# Patient Record
Sex: Male | Born: 1985 | Race: Black or African American | Hispanic: No | Marital: Single | Smoking: Current some day smoker
Health system: Southern US, Community
[De-identification: ages and names within clinical notes are randomized; demographics above are authoritative.]

## PROBLEM LIST (undated history)

## (undated) DIAGNOSIS — J302 Other seasonal allergic rhinitis: Secondary | ICD-10-CM

---
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
## 2009-01-16 ENCOUNTER — Emergency Department (HOSPITAL_BASED_OUTPATIENT_CLINIC_OR_DEPARTMENT_OTHER): Admission: EM | Admit: 2009-01-16 | Discharge: 2009-01-16 | Payer: Self-pay | Admitting: Emergency Medicine

## 2009-01-22 ENCOUNTER — Ambulatory Visit: Payer: Self-pay | Admitting: Diagnostic Radiology

## 2009-01-22 ENCOUNTER — Emergency Department (HOSPITAL_BASED_OUTPATIENT_CLINIC_OR_DEPARTMENT_OTHER): Admission: EM | Admit: 2009-01-22 | Discharge: 2009-01-22 | Payer: Self-pay | Admitting: Emergency Medicine

## 2011-01-02 ENCOUNTER — Emergency Department (INDEPENDENT_AMBULATORY_CARE_PROVIDER_SITE_OTHER): Payer: Self-pay

## 2011-01-02 ENCOUNTER — Emergency Department (HOSPITAL_BASED_OUTPATIENT_CLINIC_OR_DEPARTMENT_OTHER)
Admission: EM | Admit: 2011-01-02 | Discharge: 2011-01-02 | Disposition: A | Discharge status: Home / Self Care | Payer: Self-pay | Attending: Emergency Medicine | Admitting: Emergency Medicine

## 2011-01-02 DIAGNOSIS — R6883 Chills (without fever): Secondary | ICD-10-CM

## 2011-01-02 DIAGNOSIS — R05 Cough: Secondary | ICD-10-CM

## 2011-01-02 DIAGNOSIS — R059 Cough, unspecified: Secondary | ICD-10-CM | POA: Insufficient documentation

## 2011-01-02 DIAGNOSIS — J111 Influenza due to unidentified influenza virus with other respiratory manifestations: Secondary | ICD-10-CM | POA: Insufficient documentation

## 2011-01-02 DIAGNOSIS — R112 Nausea with vomiting, unspecified: Secondary | ICD-10-CM | POA: Insufficient documentation

## 2011-01-02 LAB — URINE MICROSCOPIC-ADD ON

## 2011-01-02 LAB — URINALYSIS, ROUTINE W REFLEX MICROSCOPIC
Bilirubin Urine: NEGATIVE
Glucose, UA: NEGATIVE mg/dL
Hgb urine dipstick: NEGATIVE
Specific Gravity, Urine: 1.035 — ABNORMAL HIGH (ref 1.005–1.030)
pH: 7 (ref 5.0–8.0)

## 2011-01-02 LAB — BASIC METABOLIC PANEL
CO2: 23 mEq/L (ref 19–32)
Glucose, Bld: 108 mg/dL — ABNORMAL HIGH (ref 70–99)
Potassium: 4 mEq/L (ref 3.5–5.1)
Sodium: 139 mEq/L (ref 135–145)

## 2011-01-06 ENCOUNTER — Emergency Department (HOSPITAL_BASED_OUTPATIENT_CLINIC_OR_DEPARTMENT_OTHER)
Admission: EM | Admit: 2011-01-06 | Discharge: 2011-01-06 | Disposition: A | Discharge status: Home / Self Care | Payer: Self-pay | Attending: Emergency Medicine | Admitting: Emergency Medicine

## 2011-01-06 ENCOUNTER — Emergency Department (INDEPENDENT_AMBULATORY_CARE_PROVIDER_SITE_OTHER): Payer: Self-pay

## 2011-01-06 DIAGNOSIS — R05 Cough: Secondary | ICD-10-CM | POA: Insufficient documentation

## 2011-01-06 DIAGNOSIS — J40 Bronchitis, not specified as acute or chronic: Secondary | ICD-10-CM | POA: Insufficient documentation

## 2011-01-06 DIAGNOSIS — R059 Cough, unspecified: Secondary | ICD-10-CM | POA: Insufficient documentation

## 2011-05-28 ENCOUNTER — Emergency Department (HOSPITAL_BASED_OUTPATIENT_CLINIC_OR_DEPARTMENT_OTHER)
Admission: EM | Admit: 2011-05-28 | Discharge: 2011-05-28 | Disposition: A | Discharge status: Home / Self Care | Payer: Self-pay | Attending: Emergency Medicine | Admitting: Emergency Medicine

## 2011-05-28 ENCOUNTER — Encounter: Payer: Self-pay | Admitting: *Deleted

## 2011-05-28 DIAGNOSIS — K0889 Other specified disorders of teeth and supporting structures: Secondary | ICD-10-CM

## 2011-05-28 DIAGNOSIS — K089 Disorder of teeth and supporting structures, unspecified: Secondary | ICD-10-CM | POA: Insufficient documentation

## 2011-05-28 MED ORDER — OXYCODONE-ACETAMINOPHEN 5-325 MG PO TABS
1.0000 | ORAL_TABLET | Freq: Once | ORAL | Status: AC
  Administered 2011-05-28: 1 via ORAL
  Filled 2011-05-28: qty 1

## 2011-05-28 MED ORDER — OXYCODONE-ACETAMINOPHEN 5-325 MG PO TABS: 2.0000 | ORAL_TABLET | ORAL | Status: AC | PRN

## 2011-05-28 NOTE — ED Provider Notes (Signed)
History     CSN: [id number] Arrival date & time: 05/28/2011 12:43 AM  Chief Complaint  Patient presents with  . Dental Pain   Patient is a 25 y.o. male presenting with tooth pain. The history is provided by the patient.  Dental PainPrimary symptoms do not include dental injury, oral bleeding, oral lesions, headaches or sore throat. The symptoms began 5 to 7 days ago. The symptoms are worsening. The symptoms occur constantly.  Additional symptoms do not include: gum swelling, purulent gums, trismus, facial swelling, excessive salivation and dry mouth.  Left upper dental pain for the last week or two. Worse this evening. No trauma. No difficutly swallowing.   History reviewed. No pertinent past medical history.  History reviewed. No pertinent past surgical history.  No family history on file.  History  Substance Use Topics  . Smoking status: Never Smoker   . Smokeless tobacco: Not on file  . Alcohol Use: No      Review of Systems  HENT: Negative for sore throat, facial swelling, neck pain and postnasal drip.   Eyes: Negative for pain.  Respiratory: Negative for chest tightness.   Neurological: Negative for headaches.    Physical Exam  BP 112/77  Pulse 68  Temp(Src) 97.7 F (36.5 C) (Oral)  Resp 18  Ht 5\' 10"  (1.778 m)  Wt 150 lb (68.04 kg)  BMI 21.52 kg/m2  SpO2 98%  Physical Exam  Nursing note and vitals reviewed. Constitutional: He appears well-developed.  HENT:  Head: Normocephalic and atraumatic.  Mouth/Throat: Oropharynx is clear and moist. No oropharyngeal exudate.       All 4 wisdom teeth growing laterally. Tender left upper. No swelling, fluctuance or redness.   Eyes: Pupils are equal, round, and reactive to light.    ED Course  Procedures  MDM Dental pain. No cavity. Related to wisdom teeth.       Juliet Rude. Rubin Payor, MD 05/28/11 [phone]

## 2011-05-28 NOTE — ED Notes (Signed)
Pt c/o pain in his upper left molar that has been mild for 2 weeks but became severe at apprx. 2100hrs last evening.

## 2011-12-27 ENCOUNTER — Emergency Department (HOSPITAL_BASED_OUTPATIENT_CLINIC_OR_DEPARTMENT_OTHER)
Admission: EM | Admit: 2011-12-27 | Discharge: 2011-12-27 | Disposition: A | Discharge status: Home / Self Care | Payer: Self-pay | Attending: Emergency Medicine | Admitting: Emergency Medicine

## 2011-12-27 ENCOUNTER — Encounter (HOSPITAL_BASED_OUTPATIENT_CLINIC_OR_DEPARTMENT_OTHER): Payer: Self-pay | Admitting: Emergency Medicine

## 2011-12-27 DIAGNOSIS — Z87891 Personal history of nicotine dependence: Secondary | ICD-10-CM | POA: Insufficient documentation

## 2011-12-27 DIAGNOSIS — K089 Disorder of teeth and supporting structures, unspecified: Secondary | ICD-10-CM | POA: Insufficient documentation

## 2011-12-27 DIAGNOSIS — K0889 Other specified disorders of teeth and supporting structures: Secondary | ICD-10-CM

## 2011-12-27 MED ORDER — IBUPROFEN 600 MG PO TABS: 600.0000 mg | ORAL_TABLET | Freq: Four times a day (QID) | ORAL | Status: AC | PRN

## 2011-12-27 MED ORDER — OXYCODONE-ACETAMINOPHEN 5-325 MG PO TABS
1.0000 | ORAL_TABLET | Freq: Once | ORAL | Status: AC
  Administered 2011-12-27: 1 via ORAL
  Filled 2011-12-27: qty 1

## 2011-12-27 MED ORDER — IBUPROFEN 400 MG PO TABS
600.0000 mg | ORAL_TABLET | Freq: Once | ORAL | Status: AC
  Administered 2011-12-27: 600 mg via ORAL
  Filled 2011-12-27: qty 1

## 2011-12-27 MED ORDER — OXYCODONE-ACETAMINOPHEN 5-325 MG PO TABS: 1.0000 | ORAL_TABLET | ORAL | Status: AC | PRN

## 2011-12-27 NOTE — ED Provider Notes (Signed)
History     CSN: [id number]  Arrival date & time 12/27/11  4098   First MD Initiated Contact with Patient 12/27/11 [phone]      Chief Complaint  Patient presents with  . Dental Pain    (Consider location/radiation/quality/duration/timing/severity/associated sxs/prior treatment) HPI Pt represents with L upper molar pain starting last night and cont throughout night. Pt seen in ED recently for similar complaint related to wisdom teeth and need for extraction. No fever, chill, facial swelling. Pt states he has appointment with dentist next week.  History reviewed. No pertinent past medical history.  History reviewed. No pertinent past surgical history.  No family history on file.  History  Substance Use Topics  . Smoking status: Former Games developer  . Smokeless tobacco: Not on file  . Alcohol Use: No      Review of Systems  Constitutional: Negative for fever and chills.  HENT: Positive for dental problem. Negative for facial swelling.     Allergies  Review of patient's allergies indicates no known allergies.  Home Medications   Current Outpatient Rx  Name Route Sig Dispense Refill  . IBUPROFEN 600 MG PO TABS Oral Take 1 tablet (600 mg total) by mouth every 6 (six) hours as needed for pain. 30 tablet 0  . OXYCODONE-ACETAMINOPHEN 5-325 MG PO TABS Oral Take 1 tablet by mouth every 4 (four) hours as needed for pain. 15 tablet 0    BP 127/87  Pulse 56  Temp(Src) 97.9 F (36.6 C) (Oral)  Resp 16  SpO2 100%  Physical Exam  Constitutional: He is oriented to person, place, and time. He appears well-developed and well-nourished.  HENT:  Head: Normocephalic.       O/P clear. Good dentition. No masses or erythema. TTP over L upper 3rd molar with obvious impaction.   Neck: Normal range of motion. Neck supple.  Pulmonary/Chest: Effort normal.  Abdominal: Soft.  Musculoskeletal: Normal range of motion.  Lymphadenopathy:    He has no cervical adenopathy.  Neurological: He is  alert and oriented to person, place, and time.  Skin: Skin is warm and dry.  Psychiatric: He has a normal mood and affect. His behavior is normal.    ED Course  Procedures (including critical care time)  Labs Reviewed - No data to display No results found.   1. Pain, dental       MDM  Will treat for dental pain and encourage f/u with dentist. Pt advised to return for fever, swelling, masses or any concerns.         Loren Racer, MD 12/27/11 [phone]

## 2011-12-27 NOTE — ED Notes (Signed)
Pt c/o toothache (LT upper) since last pm

## 2011-12-27 NOTE — Discharge Instructions (Signed)
Dental Pain  A tooth ache may be caused by cavities (tooth decay). Cavities expose the nerve of the tooth to air and hot or cold temperatures. It may come from an infection or abscess (also called a boil or furuncle) around your tooth. It is also often caused by dental caries (tooth decay). This causes the pain you are having.  DIAGNOSIS   Your caregiver can diagnose this problem by exam.  TREATMENT   · If caused by an infection, it may be treated with medications which kill germs (antibiotics) and pain medications as prescribed by your caregiver. Take medications as directed.  · Only take over-the-counter or prescription medicines for pain, discomfort, or fever as directed by your caregiver.  · Whether the tooth ache today is caused by infection or dental disease, you should see your dentist as soon as possible for further care.  SEEK MEDICAL CARE IF:  The exam and treatment you received today has been provided on an emergency basis only. This is not a substitute for complete medical or dental care. If your problem worsens or new problems (symptoms) appear, and you are unable to meet with your dentist, call or return to this location.  SEEK IMMEDIATE MEDICAL CARE IF:   · You have a fever.  · You develop redness and swelling of your face, jaw, or neck.  · You are unable to open your mouth.  · You have severe pain uncontrolled by pain medicine.  MAKE SURE YOU:   · Understand these instructions.  · Will watch your condition.  · Will get help right away if you are not doing well or get worse.  Document Released: 10/05/2005 Document Revised: 09/24/2011 Document Reviewed: 05/23/2008  ExitCare® Patient Information ©2012 [employer].

## 2012-01-24 ENCOUNTER — Emergency Department (HOSPITAL_BASED_OUTPATIENT_CLINIC_OR_DEPARTMENT_OTHER)
Admission: EM | Admit: 2012-01-24 | Discharge: 2012-01-24 | Disposition: A | Discharge status: Home / Self Care | Payer: Self-pay | Attending: Emergency Medicine | Admitting: Emergency Medicine

## 2012-01-24 ENCOUNTER — Encounter (HOSPITAL_BASED_OUTPATIENT_CLINIC_OR_DEPARTMENT_OTHER): Payer: Self-pay | Admitting: *Deleted

## 2012-01-24 ENCOUNTER — Emergency Department (INDEPENDENT_AMBULATORY_CARE_PROVIDER_SITE_OTHER): Payer: Self-pay

## 2012-01-24 DIAGNOSIS — S99929A Unspecified injury of unspecified foot, initial encounter: Secondary | ICD-10-CM | POA: Insufficient documentation

## 2012-01-24 DIAGNOSIS — Y9229 Other specified public building as the place of occurrence of the external cause: Secondary | ICD-10-CM | POA: Insufficient documentation

## 2012-01-24 DIAGNOSIS — X58XXXA Exposure to other specified factors, initial encounter: Secondary | ICD-10-CM | POA: Insufficient documentation

## 2012-01-24 DIAGNOSIS — S8990XA Unspecified injury of unspecified lower leg, initial encounter: Secondary | ICD-10-CM | POA: Insufficient documentation

## 2012-01-24 DIAGNOSIS — M25569 Pain in unspecified knee: Secondary | ICD-10-CM

## 2012-01-24 DIAGNOSIS — S99919A Unspecified injury of unspecified ankle, initial encounter: Secondary | ICD-10-CM

## 2012-01-24 MED ORDER — NAPROXEN 500 MG PO TABS: 500.0000 mg | ORAL_TABLET | Freq: Two times a day (BID) | ORAL | Status: AC

## 2012-01-24 MED ORDER — HYDROCODONE-ACETAMINOPHEN 5-500 MG PO TABS: 1.0000 | ORAL_TABLET | Freq: Four times a day (QID) | ORAL | Status: AC | PRN

## 2012-01-24 MED ORDER — IBUPROFEN 800 MG PO TABS
800.0000 mg | ORAL_TABLET | Freq: Once | ORAL | Status: AC
  Administered 2012-01-24: 800 mg via ORAL
  Filled 2012-01-24: qty 1

## 2012-01-24 MED ORDER — OXYCODONE-ACETAMINOPHEN 5-325 MG PO TABS
2.0000 | ORAL_TABLET | Freq: Once | ORAL | Status: AC
  Administered 2012-01-24: 2 via ORAL
  Filled 2012-01-24: qty 2

## 2012-01-24 NOTE — Discharge Instructions (Signed)
Knee Pain The knee is the complex joint between your thigh and your lower leg. It is made up of bones, tendons, ligaments, and cartilage. The bones that make up the knee are:  The femur in the thigh.   The tibia and fibula in the lower leg.   The patella or kneecap riding in the groove on the lower femur.  CAUSES  Knee pain is a common complaint with many causes. A few of these causes are:  Injury, such as:   A ruptured ligament or tendon injury.   Torn cartilage.   Medical conditions, such as:   Gout   Arthritis   Infections   Overuse, over training or overdoing a physical activity.  Knee pain can be minor or severe. Knee pain can accompany debilitating injury. Minor knee problems often respond well to self-care measures or get well on their own. More serious injuries may need medical intervention or even surgery. SYMPTOMS The knee is complex. Symptoms of knee problems can vary widely. Some of the problems are:  Pain with movement and weight bearing.   Swelling and tenderness.   Buckling of the knee.   Inability to straighten or extend your knee.   Your knee locks and you cannot straighten it.   Warmth and redness with pain and fever.   Deformity or dislocation of the kneecap.  DIAGNOSIS  Determining what is wrong may be very straight forward such as when there is an injury. It can also be challenging because of the complexity of the knee. Tests to make a diagnosis may include:  Your caregiver taking a history and doing a physical exam.   Routine X-rays can be used to rule out other problems. X-rays will not reveal a cartilage tear. Some injuries of the knee can be diagnosed by:   Arthroscopy a surgical technique by which a small video camera is inserted through tiny incisions on the sides of the knee. This procedure is used to examine and repair internal knee joint problems. Tiny instruments can be used during arthroscopy to repair the torn knee cartilage  (meniscus).   Arthrography is a radiology technique. A contrast liquid is directly injected into the knee joint. Internal structures of the knee joint then become visible on X-ray film.   An MRI scan is a non x-ray radiology procedure in which magnetic fields and a computer produce two- or three-dimensional images of the inside of the knee. Cartilage tears are often visible using an MRI scanner. MRI scans have largely replaced arthrography in diagnosing cartilage tears of the knee.   Blood work.   Examination of the fluid that helps to lubricate the knee joint (synovial fluid). This is done by taking a sample out using a needle and a syringe.  TREATMENT The treatment of knee problems depends on the cause. Some of these treatments are:  Depending on the injury, proper casting, splinting, surgery or physical therapy care will be needed.   Give yourself adequate recovery time. Do not overuse your joints. If you begin to get sore during workout routines, back off. Slow down or do fewer repetitions.   For repetitive activities such as cycling or running, maintain your strength and nutrition.   Alternate muscle groups. For example if you are a weight lifter, work the upper body on one day and the lower body the next.   Either tight or weak muscles do not give the proper support for your knee. Tight or weak muscles do not absorb the stress placed   on the knee joint. Keep the muscles surrounding the knee strong.   Take care of mechanical problems.   If you have flat feet, orthotics or special shoes may help. See your caregiver if you need help.   Arch supports, sometimes with wedges on the inner or outer aspect of the heel, can help. These can shift pressure away from the side of the knee most bothered by osteoarthritis.   A brace called an "unloader" brace also may be used to help ease the pressure on the most arthritic side of the knee.   If your caregiver has prescribed crutches, braces,  wraps or ice, use as directed. The acronym for this is PRICE. This means protection, rest, ice, compression and elevation.   Nonsteroidal anti-inflammatory drugs (NSAID's), can help relieve pain. But if taken immediately after an injury, they may actually increase swelling. Take NSAID's with food in your stomach. Stop them if you develop stomach problems. Do not take these if you have a history of ulcers, stomach pain or bleeding from the bowel. Do not take without your caregiver's approval if you have problems with fluid retention, heart failure, or kidney problems.   For ongoing knee problems, physical therapy may be helpful.   Glucosamine and chondroitin are over-the-counter dietary supplements. Both may help relieve the pain of osteoarthritis in the knee. These medicines are different from the usual anti-inflammatory drugs. Glucosamine may decrease the rate of cartilage destruction.   Injections of a corticosteroid drug into your knee joint may help reduce the symptoms of an arthritis flare-up. They may provide pain relief that lasts a few months. You may have to wait a few months between injections. The injections do have a small increased risk of infection, water retention and elevated blood sugar levels.   Hyaluronic acid injected into damaged joints may ease pain and provide lubrication. These injections may work by reducing inflammation. A series of shots may give relief for as long as 6 months.   Topical painkillers. Applying certain ointments to your skin may help relieve the pain and stiffness of osteoarthritis. Ask your pharmacist for suggestions. Many over the-counter products are approved for temporary relief of arthritis pain.   In some countries, doctors often prescribe topical NSAID's for relief of chronic conditions such as arthritis and tendinitis. A review of treatment with NSAID creams found that they worked as well as oral medications but without the serious side effects.    PREVENTION  Maintain a healthy weight. Extra pounds put more strain on your joints.   Get strong, stay limber. Weak muscles are a common cause of knee injuries. Stretching is important. Include flexibility exercises in your workouts.   Be smart about exercise. If you have osteoarthritis, chronic knee pain or recurring injuries, you may need to change the way you exercise. This does not mean you have to stop being active. If your knees ache after jogging or playing basketball, consider switching to swimming, water aerobics or other low-impact activities, at least for a few days a week. Sometimes limiting high-impact activities will provide relief.   Make sure your shoes fit well. Choose footwear that is right for your sport.   Protect your knees. Use the proper gear for knee-sensitive activities. Use kneepads when playing volleyball or laying carpet. Buckle your seat belt every time you drive. Most shattered kneecaps occur in car accidents.   Rest when you are tired.  SEEK MEDICAL CARE IF:  You have knee pain that is continual and does not   seem to be getting better.  SEEK IMMEDIATE MEDICAL CARE IF:  Your knee joint feels hot to the touch and you have a high fever. MAKE SURE YOU:   Understand these instructions.   Will watch your condition.   Will get help right away if you are not doing well or get worse.  Document Released: 08/02/2007 Document Revised: 09/24/2011 Document Reviewed: 08/02/2007 Exit[employer] Patient Information 2012 [employer]. 

## 2012-01-24 NOTE — ED Notes (Signed)
Patient injured L knee while playing basketball last night, hurts to bear weight or touch

## 2012-01-24 NOTE — ED Provider Notes (Signed)
History     CSN: [id number]  Arrival date & time 01/24/12  4098   First MD Initiated Contact with Patient 01/24/12 [phone]      Chief Complaint  Patient presents with  . Knee Injury    (Consider location/radiation/quality/duration/timing/severity/associated sxs/prior treatment) HPI Comments: Injury to the left knee which occurred while playing at school. States he landed flat-footed on extended knee which buckled. Gradually worsening pain since. Has difficulty bearing weight  Patient is a 26 y.o. male presenting with knee pain. The history is provided by the patient. No language interpreter was used.  Knee Pain This is a new problem. The current episode started yesterday. The problem occurs constantly. The problem has been gradually worsening. Pertinent negatives include no chest pain, no abdominal pain, no headaches and no shortness of breath. The symptoms are aggravated by walking, bending and twisting. The symptoms are relieved by nothing. Treatments tried: oxycodone. The treatment provided moderate relief.    History reviewed. No pertinent past medical history.  History reviewed. No pertinent past surgical history.  No family history on file.  History  Substance Use Topics  . Smoking status: Former Games developer  . Smokeless tobacco: Not on file  . Alcohol Use: No      Review of Systems  Constitutional: Negative for fever, chills, activity change, appetite change and fatigue.  HENT: Negative for congestion, sore throat, rhinorrhea, neck pain and neck stiffness.   Respiratory: Negative for cough and shortness of breath.   Cardiovascular: Negative for chest pain and palpitations.  Gastrointestinal: Negative for nausea, vomiting and abdominal pain.  Genitourinary: Negative for dysuria, urgency, frequency and flank pain.  Musculoskeletal: Positive for joint swelling and arthralgias. Negative for myalgias and back pain.  Neurological: Negative for dizziness, weakness,  light-headedness, numbness and headaches.  All other systems reviewed and are negative.    Allergies  Review of patient's allergies indicates no known allergies.  Home Medications   Current Outpatient Rx  Name Route Sig Dispense Refill  . HYDROCODONE-ACETAMINOPHEN 5-500 MG PO TABS Oral Take 1-2 tablets by mouth every 6 (six) hours as needed for pain. 15 tablet 0  . NAPROXEN 500 MG PO TABS Oral Take 1 tablet (500 mg total) by mouth 2 (two) times daily. 30 tablet 0    BP 105/82  Pulse 94  Temp(Src) 97.7 F (36.5 C) (Oral)  Resp 21  SpO2 100%  Physical Exam  Nursing note and vitals reviewed. Constitutional: He is oriented to person, place, and time. He appears well-developed and well-nourished.       Uncomfortable in appearance  HENT:  Head: Normocephalic and atraumatic.  Mouth/Throat: Oropharynx is clear and moist. No oropharyngeal exudate.  Eyes: Conjunctivae and EOM are normal. Pupils are equal, round, and reactive to light.  Neck: Normal range of motion. Neck supple.  Cardiovascular: Normal rate, regular rhythm, normal heart sounds and intact distal pulses.  Exam reveals no gallop and no friction rub.   No murmur heard. Pulmonary/Chest: Effort normal and breath sounds normal. No respiratory distress. He exhibits no tenderness.  Abdominal: Soft. Bowel sounds are normal. There is no tenderness.  Musculoskeletal:       Left knee: He exhibits decreased range of motion and swelling. He exhibits no effusion and no deformity. tenderness found. Medial joint line, lateral joint line, MCL and LCL tenderness noted. No patellar tendon tenderness noted.       Negative anterior and posterior drawer however inaccurate due to acute injury. Negative mcmurray  Neurological: He is alert  and oriented to person, place, and time. No cranial nerve deficit.  Skin: Skin is warm and dry. No rash noted.    ED Course  Procedures (including critical care time)  Labs Reviewed - No data to  display Dg Knee Complete 4 Views Left  01/24/2012  *RADIOLOGY REPORT*  Clinical Data: Left knee injury with pain.  LEFT KNEE - COMPLETE 4+ VIEW  Comparison:  None.  Findings:  There is no evidence of fracture, dislocation, or joint effusion.  There is no evidence of arthropathy or other focal bone abnormality.  Soft tissues are unremarkable.  IMPRESSION: Negative.  Original Report Authenticated By: Reola Calkins, M.D.     1. Knee injury       MDM  Knee injury with no specific bony injury. X-rays negative. Was placed in a knee immobilizer and crutches were provided. Instructed to followup with orthopedics or sports medicine in one week. Will be provided and anti-inflammatory medication and pain medication. Should you to apply ice. Ice was placed in the emergency department. Prescription the symptoms for return. No neurologic or vascular compromise.        Dayton Bailiff, MD 01/24/12 [phone]

## 2012-02-02 ENCOUNTER — Encounter: Payer: Self-pay | Admitting: Family Medicine

## 2012-02-02 ENCOUNTER — Ambulatory Visit (INDEPENDENT_AMBULATORY_CARE_PROVIDER_SITE_OTHER): Payer: Self-pay | Admitting: Family Medicine

## 2012-02-02 VITALS — BP 115/76 | HR 86 | Temp 98.2°F | Ht 70.0 in | Wt 155.0 lb

## 2012-02-02 DIAGNOSIS — S99929A Unspecified injury of unspecified foot, initial encounter: Secondary | ICD-10-CM

## 2012-02-02 DIAGNOSIS — S8992XA Unspecified injury of left lower leg, initial encounter: Secondary | ICD-10-CM | POA: Insufficient documentation

## 2012-02-02 NOTE — Assessment & Plan Note (Signed)
consistent with Grade 2 MCL sprain, medial meniscal tear, and possible ACL tear (difficult due to patient's guarding but feels intact on exam).  Continue immobilizer for 3 more weeks.  Icing, home quad strengthening, aleve, percocet as needed, ace wrap, elevation.  F/u in 3 weeks for reevaluation.  If not improving as expected would move forward with MRI but patient at this time does not have insurance - due to kick in in about a month or so.  Also given [employer] coverage paperwork to see if he qualifies.

## 2012-02-02 NOTE — Progress Notes (Addendum)
  Subjective:    Patient ID: Christian Burch, male    DOB: September 11, 1986, 26 y.o.   MRN: [id number]  PCP: None  HPI 26 yo M here for left knee injury.  Patient reports on 4/7 he was playing basketball. He came down and hyperextended his left knee, felt it buckle backwards. Unable to bear weight following this. + swelling but no bruising. No prior knee injuries. Had x-rays that were negative for fracture. Was placed in immobilizer and given crutches. Has been taking naproxen and vicodin for pain. Did not hear a pop with the injury.  History reviewed. No pertinent past medical history.  Current Outpatient Prescriptions on File Prior to Visit  Medication Sig Dispense Refill  . HYDROcodone-acetaminophen (VICODIN) 5-500 MG per tablet Take 1-2 tablets by mouth every 6 (six) hours as needed for pain.  15 tablet  0  . naproxen (NAPROSYN) 500 MG tablet Take 1 tablet (500 mg total) by mouth 2 (two) times daily.  30 tablet  0    History reviewed. No pertinent past surgical history.  No Known Allergies  History   Social History  . Marital Status: Single    Spouse Name: N/A    Number of Children: N/A  . Years of Education: N/A   Occupational History  . Not on file.   Social History Main Topics  . Smoking status: Former Games developer  . Smokeless tobacco: Not on file  . Alcohol Use: No  . Drug Use: No  . Sexually Active: Not on file   Other Topics Concern  . Not on file   Social History Narrative  . No narrative on file    Family History  Problem Relation Age of Onset  . Sudden death Neg Hx   . Hypertension Neg Hx   . Heart attack Neg Hx   . Hyperlipidemia Neg Hx   . Diabetes Neg Hx     BP 115/76  Pulse 86  Temp(Src) 98.2 F (36.8 C) (Oral)  Ht 5\' 10"  (1.778 m)  Wt 155 lb (70.308 kg)  BMI 22.24 kg/m2  Review of Systems See HPI above.    Objective:   Physical Exam Gen: NAD  L knee: Mild effusion.  No deformity, ecchymoses. TTP medial joint line and post patellar  facets.  No lat joint line or other TTP. ROM 0 - 90 degrees - pain on full flexion. Negative ant/post drawers. 1+ with valgus and painful medially.  Negative varus testing. Negative lachmanns. Positive mcmurrays and apleys medially.  Negative patellar apprehension, clarkes. NV intact distally.  R knee:  FROM without pain or instability.    Assessment & Plan:  1. Left knee injury - consistent with Grade 2 MCL sprain, medial meniscal tear, and possible ACL tear (difficult due to patient's guarding but feels intact on exam).  Continue immobilizer for 3 more weeks.  Icing, home quad strengthening, aleve, percocet as needed (rx today #60 to take q6h prn), ace wrap, elevation.  F/u in 3 weeks for reevaluation.  If not improving as expected would move forward with MRI but patient at this time does not have insurance - due to kick in in about a month or so.  Also given [facility] coverage paperwork to see if he qualifies.

## 2012-02-02 NOTE — Patient Instructions (Signed)
Your injury and exam are most consistent with a medial meniscus tear, MCL tear, and possible ACL tear (sometimes hard to tell when in this much pain). Wear immobilizer for 3 more weeks for support. Ice your knee 15 minutes at a time 3-4 times a day. Do quad sets and straight leg raises 3 sets of 10 once a day. Aleve 1-2 tabs twice a day with food for pain and inflammation. Percocet as needed for severe pain (no driving while on this medication). Can use ace wrap under immobilizer for more compression. Follow up with me in 3 weeks. If you're not improving the next step would be an MRI - we may have to wait for your benefits to kick in if it comes to this however.

## 2012-02-23 ENCOUNTER — Ambulatory Visit (INDEPENDENT_AMBULATORY_CARE_PROVIDER_SITE_OTHER): Payer: Self-pay | Admitting: Family Medicine

## 2012-02-23 ENCOUNTER — Encounter: Payer: Self-pay | Admitting: Family Medicine

## 2012-02-23 VITALS — BP 107/71 | HR 82 | Temp 98.1°F | Ht 70.0 in | Wt 150.0 lb

## 2012-02-23 DIAGNOSIS — S8992XA Unspecified injury of left lower leg, initial encounter: Secondary | ICD-10-CM

## 2012-02-23 DIAGNOSIS — S99919A Unspecified injury of unspecified ankle, initial encounter: Secondary | ICD-10-CM

## 2012-02-23 NOTE — Assessment & Plan Note (Signed)
MCL healed at this point.  ACL feels intact as well.  Most concerning for a medial meniscal tear.  He is happy with his progress, no mechanical symptoms so will continue to treat conservatively.  Awaiting approval from [facility] coverage - if this goes through and he's still having problems, can add formal physical therapy.  Call with any concerns.  Advance activities as tolerated.  Can also consider intraarticular injection if struggling with pain in future.

## 2012-02-23 NOTE — Progress Notes (Signed)
  Subjective:    Patient ID: Christian Burch, male    DOB: 07/23/1986, 26 y.o.   MRN: [id number]  PCP: None  HPI  26 yo M here for f/u left knee injury.  4/16: Patient reports on 4/7 he was playing basketball. He came down and hyperextended his left knee, felt it buckle backwards. Unable to bear weight following this. + swelling but no bruising. No prior knee injuries. Had x-rays that were negative for fracture. Was placed in immobilizer and given crutches. Has been taking naproxen and vicodin for pain. Did not hear a pop with the injury.  5/7: Patient reports he is much better. Only has a little bit of medial pain when turning certain directions. No instability. No longer taking any medications. No catching or locking. Doing home quad strengthening exercises. Stopped using immobilizer about 1 1/2 weeks ago.  History reviewed. No pertinent past medical history.  Current Outpatient Prescriptions on File Prior to Visit  Medication Sig Dispense Refill  . naproxen (NAPROSYN) 500 MG tablet Take 1 tablet (500 mg total) by mouth 2 (two) times daily.  30 tablet  0    History reviewed. No pertinent past surgical history.  No Known Allergies  History   Social History  . Marital Status: Single    Spouse Name: N/A    Number of Children: N/A  . Years of Education: N/A   Occupational History  . Not on file.   Social History Main Topics  . Smoking status: Former Games developer  . Smokeless tobacco: Not on file  . Alcohol Use: No  . Drug Use: No  . Sexually Active: Not on file   Other Topics Concern  . Not on file   Social History Narrative  . No narrative on file    Family History  Problem Relation Age of Onset  . Sudden death Neg Hx   . Hypertension Neg Hx   . Heart attack Neg Hx   . Hyperlipidemia Neg Hx   . Diabetes Neg Hx     BP 107/71  Pulse 82  Temp(Src) 98.1 F (36.7 C) (Oral)  Ht 5\' 10"  (1.778 m)  Wt 150 lb (68.04 kg)  BMI 21.52 kg/m2  Review of  Systems  See HPI above.    Objective:   Physical Exam  Gen: NAD  L knee: No effusion.  No deformity, ecchymoses. TTP medial joint line.  No lat joint line or other TTP. ROM 0 - 120 degrees - no pain on full flexion but feels stiff. Negative ant/post drawers. Negative valgus testing without pain.  Negative varus testing. Negative lachmanns. Positive mcmurrays and apleys medially.  Negative patellar apprehension, clarkes. NV intact distally.  R knee:  FROM without pain or instability.    Assessment & Plan:  1. Left knee injury - MCL healed at this point.  ACL feels intact as well.  Most concerning for a medial meniscal tear.  He is happy with his progress, no mechanical symptoms so will continue to treat conservatively.  Awaiting approval from [facility] coverage - if this goes through and he's still having problems, can add formal physical therapy.  Call with any concerns.  Advance activities as tolerated.  Can also consider intraarticular injection if struggling with pain in future.

## 2017-03-16 ENCOUNTER — Encounter (HOSPITAL_BASED_OUTPATIENT_CLINIC_OR_DEPARTMENT_OTHER): Payer: Self-pay

## 2017-03-16 ENCOUNTER — Emergency Department (HOSPITAL_BASED_OUTPATIENT_CLINIC_OR_DEPARTMENT_OTHER): Payer: Self-pay

## 2017-03-16 ENCOUNTER — Emergency Department (HOSPITAL_BASED_OUTPATIENT_CLINIC_OR_DEPARTMENT_OTHER)
Admission: EM | Admit: 2017-03-16 | Discharge: 2017-03-16 | Disposition: A | Discharge status: Home / Self Care | Payer: Self-pay | Attending: Emergency Medicine | Admitting: Emergency Medicine

## 2017-03-16 DIAGNOSIS — Z87891 Personal history of nicotine dependence: Secondary | ICD-10-CM | POA: Insufficient documentation

## 2017-03-16 DIAGNOSIS — X501XXA Overexertion from prolonged static or awkward postures, initial encounter: Secondary | ICD-10-CM | POA: Insufficient documentation

## 2017-03-16 DIAGNOSIS — Y998 Other external cause status: Secondary | ICD-10-CM | POA: Insufficient documentation

## 2017-03-16 DIAGNOSIS — Y929 Unspecified place or not applicable: Secondary | ICD-10-CM | POA: Insufficient documentation

## 2017-03-16 DIAGNOSIS — S93601A Unspecified sprain of right foot, initial encounter: Secondary | ICD-10-CM | POA: Insufficient documentation

## 2017-03-16 DIAGNOSIS — Y9367 Activity, basketball: Secondary | ICD-10-CM | POA: Insufficient documentation

## 2017-03-16 HISTORY — DX: Other seasonal allergic rhinitis: J30.2

## 2017-03-16 MED ORDER — IBUPROFEN 800 MG PO TABS
800.0000 mg | ORAL_TABLET | Freq: Once | ORAL | Status: AC
  Administered 2017-03-16: 800 mg via ORAL
  Filled 2017-03-16: qty 1

## 2017-03-16 MED ORDER — NAPROXEN 250 MG PO TABS: 500.0000 mg | ORAL_TABLET | Freq: Once | ORAL | Status: DC

## 2017-03-16 MED ORDER — NAPROXEN 500 MG PO TABS: ORAL_TABLET | ORAL | 0 refills | Status: AC

## 2017-03-16 NOTE — ED Provider Notes (Signed)
[facility] Provider Note: Lowella Dell, MD, FACEP  CSN: [id number] MRN: [id number] ARRIVAL: 03/16/17 at 0247 ROOM: [facility]   CHIEF COMPLAINT  Foot Injury   HISTORY OF PRESENT ILLNESS  Christian Burch is a 31 y.o. male who was playing basketball yesterday afternoon about 4 PM. He stepped onto his right forefoot and felt a pop in his left medial heel. He has had the gradual onset of pain at that site since the injury. He rates his pain as a 7 out of 10 at its worst. Pain is worse with palpation, movement or weightbearing. He is having difficulty bearing weight. There is some associated swelling but no deformity. He denies other injury.   Past Medical History:  Diagnosis Date  . Seasonal allergies     History reviewed. No pertinent surgical history.  Family History  Problem Relation Age of Onset  . Sudden death Neg Hx   . Hypertension Neg Hx   . Heart attack Neg Hx   . Hyperlipidemia Neg Hx   . Diabetes Neg Hx     Social History  Substance Use Topics  . Smoking status: Former Games developer  . Smokeless tobacco: Never Used  . Alcohol use No     Comment: social    Prior to Admission medications   Medication Sig Start Date End Date Taking? Authorizing Provider  Cetirizine HCl (ZYRTEC ALLERGY PO) Take by mouth.   Yes [provider]    Allergies Patient has no known allergies.   REVIEW OF SYSTEMS  Negative except as noted here or in the History of Present Illness.   PHYSICAL EXAMINATION  Initial Vital Signs Blood pressure 108/68, pulse 60, temperature 97.9 F (36.6 C), temperature source Oral, resp. rate 18, height 5\' 9"  (1.753 m), weight 68 kg (150 lb), SpO2 97 %.  Examination General: Well-developed, well-nourished male in no acute distress; appearance consistent with age of record HENT: normocephalic; atraumatic Eyes: Normal appearance Neck: supple Heart: regular rate and rhythm Lungs: clear to auscultation bilaterally Abdomen: soft;  nondistended Extremities: No deformity; full range of motion except right ankle due to pain; pulses normal; tenderness and swelling of medial right heel, right foot distally neurovascularly intact with intact tendon function Neurologic: Awake, alert and oriented; motor function intact in all extremities and symmetric; no facial droop Skin: Warm and dry Psychiatric: Normal mood and affect   RESULTS  Summary of this visit's results, reviewed by myself:   EKG Interpretation  Date/Time:    Ventricular Rate:    PR Interval:    QRS Duration:   QT Interval:    QTC Calculation:   R Axis:     Text Interpretation:        Laboratory Studies: No results found for this or any previous visit (from the past 24 hour(s)). Imaging Studies: Dg Foot Complete Right  Result Date: 03/16/2017 CLINICAL DATA:  RIGHT calcaneal pain after playing basketball yesterday. Felt a pop. EXAM: RIGHT FOOT COMPLETE - 3+ VIEW COMPARISON:  None. FINDINGS: There is no evidence of fracture or dislocation. Small plantar calcaneal spur. There is no evidence of arthropathy or other focal bone abnormality. Soft tissues are unremarkable. IMPRESSION: Negative. Electronically Signed   By: Awilda Metro M.D.   On: 03/16/2017 03:33    ED COURSE  Nursing notes and initial vitals signs, including pulse oximetry, reviewed.  Vitals:   03/16/17 0254 03/16/17 0255  BP: 108/68   Pulse: 60   Resp: 18   Temp: 97.9 F (36.6  C)   TempSrc: Oral   SpO2: 97%   Weight:  68 kg (150 lb)  Height:  5\' 9"  (1.753 m)    PROCEDURES    ED DIAGNOSES     ICD-9-CM ICD-10-CM   1. Sprain of right foot, initial encounter 845.10 S93.601A        Ece Cumberland, Jonny RuizJohn, MD 03/16/17 [phone]

## 2017-03-16 NOTE — ED Triage Notes (Signed)
Pt reports right foot and heel pain since Monday noted after playing basketball, reports stepping down wrong and hearing a "pop" - pt unable to apply pressure, swelling noted. NO meds PTA. CMS intact.

## 2020-05-15 ENCOUNTER — Emergency Department (HOSPITAL_BASED_OUTPATIENT_CLINIC_OR_DEPARTMENT_OTHER)
Admission: EM | Admit: 2020-05-15 | Discharge: 2020-05-15 | Disposition: A | Discharge status: Home / Self Care | Payer: Managed Care, Other (non HMO) | Attending: Emergency Medicine | Admitting: Emergency Medicine

## 2020-05-15 ENCOUNTER — Encounter (HOSPITAL_BASED_OUTPATIENT_CLINIC_OR_DEPARTMENT_OTHER): Payer: Self-pay | Admitting: Emergency Medicine

## 2020-05-15 ENCOUNTER — Emergency Department (HOSPITAL_BASED_OUTPATIENT_CLINIC_OR_DEPARTMENT_OTHER): Payer: Managed Care, Other (non HMO)

## 2020-05-15 ENCOUNTER — Other Ambulatory Visit: Payer: Self-pay

## 2020-05-15 DIAGNOSIS — M791 Myalgia, unspecified site: Secondary | ICD-10-CM | POA: Insufficient documentation

## 2020-05-15 DIAGNOSIS — F17290 Nicotine dependence, other tobacco product, uncomplicated: Secondary | ICD-10-CM | POA: Diagnosis not present

## 2020-05-15 DIAGNOSIS — M79672 Pain in left foot: Secondary | ICD-10-CM

## 2020-05-15 MED ORDER — IBUPROFEN 800 MG PO TABS
800.0000 mg | ORAL_TABLET | Freq: Once | ORAL | Status: AC
  Administered 2020-05-15: 800 mg via ORAL
  Filled 2020-05-15: qty 1

## 2020-05-15 MED ORDER — IBUPROFEN 800 MG PO TABS: 800.0000 mg | ORAL_TABLET | Freq: Three times a day (TID) | ORAL | 0 refills | Status: DC | PRN

## 2020-05-15 NOTE — ED Triage Notes (Signed)
Pt states he was running up some steps yesterday and felt something pop in his left heel  Pt states he has been resting it and took some ibuprofen but it still hurts  Pt states it hurts to walk on

## 2020-05-15 NOTE — ED Provider Notes (Signed)
[facility] EMERGENCY DEPARTMENT Provider Note   CSN: [id number] Arrival date & time: 05/15/20  0402     History Chief Complaint  Patient presents with  . Foot Pain    Christian Burch is a 34 y.o. male.  Patient with left foot and heel pain that onset yesterday after he was running up some stairs.  He states he felt a pop in his left heel as he was running up some stairs.  Since then he has had severe pain with weightbearing to his sole of his foot and left heel.  Denies falling or hitting his head.  Taking ibuprofen without relief.  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  Denies any weakness, numbness or tingling.  Had a similar injury many years ago playing basketball to hear illness own.  The history is provided by the patient.  Foot Pain Pertinent negatives include no chest pain, no abdominal pain, no headaches and no shortness of breath.       Past Medical History:  Diagnosis Date  . Seasonal allergies     Patient Active Problem List   Diagnosis Date Noted  . Left knee injury 02/02/2012    History reviewed. No pertinent surgical history.     Family History  Problem Relation Age of Onset  . Sudden death Neg Hx   . Hypertension Neg Hx   . Heart attack Neg Hx   . Hyperlipidemia Neg Hx   . Diabetes Neg Hx     Social History   Tobacco Use  . Smoking status: Current Some Day Smoker    Types: Cigars  . Smokeless tobacco: Never Used  Vaping Use  . Vaping Use: Never used  Substance Use Topics  . Alcohol use: No    Comment: social  . Drug use: No    Home Medications Prior to Admission medications   Medication Sig Start Date End Date Taking? Authorizing Provider  Cetirizine HCl (ZYRTEC ALLERGY PO) Take by mouth.    [provider]  naproxen (NAPROSYN) 500 MG tablet Take one tablet twice daily as needed for pain. 03/16/17   Molpus, Jonny Ruiz, MD    Allergies    Patient has no known allergies.  Review of Systems   Review of Systems    Constitutional: Negative for activity change, appetite change and fever.  HENT: Negative for congestion and rhinorrhea.   Eyes: Negative for visual disturbance.  Respiratory: Negative for cough and shortness of breath.   Cardiovascular: Negative for chest pain.  Gastrointestinal: Negative for abdominal pain, nausea and vomiting.  Genitourinary: Negative for dysuria and hematuria.  Musculoskeletal: Positive for arthralgias and myalgias.  Skin: Negative for rash.  Neurological: Negative for dizziness, weakness and headaches.   all other systems are negative except as noted in the HPI and PMH.    Physical Exam Updated Vital Signs BP 109/77 (BP Location: Left Arm)   Pulse 62   Temp 98.1 F (36.7 C) (Oral)   Resp 14   Ht 5\' 10"  (1.778 m)   Wt 68 kg   SpO2 98%   BMI 21.52 kg/m   Physical Exam Vitals and nursing note reviewed.  Constitutional:      General: He is not in acute distress.    Appearance: He is well-developed.  HENT:     Head: Normocephalic and atraumatic.     Mouth/Throat:     Pharynx: No oropharyngeal exudate.  Eyes:     Conjunctiva/sclera: Conjunctivae normal.     Pupils:  Pupils are equal, round, and reactive to light.  Neck:     Comments: No meningismus. Cardiovascular:     Rate and Rhythm: Normal rate and regular rhythm.     Heart sounds: Normal heart sounds. No murmur heard.   Pulmonary:     Effort: Pulmonary effort is normal. No respiratory distress.     Breath sounds: Normal breath sounds.  Abdominal:     Palpations: Abdomen is soft.     Tenderness: There is no abdominal tenderness. There is no guarding or rebound.  Musculoskeletal:        General: Tenderness present. Normal range of motion.     Cervical back: Normal range of motion and neck supple.     Comments: Tenderness to palpation of the left heel at plantar fascia insertion.  Ankle joint appears to be stable.  Intact DP and PT pulses. Able to flex and extend ankle without  difficulty. Achilles function appears to be intact.  No discontinuity along course of Achilles tendon.  Skin:    General: Skin is warm.  Neurological:     Mental Status: He is alert and oriented to person, place, and time.     Cranial Nerves: No cranial nerve deficit.     Motor: No abnormal muscle tone.     Coordination: Coordination normal.     Comments: No ataxia on finger to nose bilaterally. No pronator drift. 5/5 strength throughout. CN 2-12 intact.Equal grip strength. Sensation intact.   Psychiatric:        Behavior: Behavior normal.     ED Results / Procedures / Treatments   Labs (all labs ordered are listed, but only abnormal results are displayed) Labs Reviewed - No data to display  EKG None  Radiology DG Ankle Complete Left  Result Date: 05/15/2020 CLINICAL DATA:  Pain with popping sensation in the heel. EXAM: LEFT ANKLE COMPLETE - 3+ VIEW COMPARISON:  None. FINDINGS: No acute fracture or subluxation. Corticated ossicle at the distal fibula. Amorphous calcified density at the level of the interosseous membrane. Small talar neck and plantar heel spurs. IMPRESSION: 1. No acute finding. 2. Small heel spur. 3. Probable heterotopic ossification near the lower interosseous membrane, please correlate for leg symptoms. Electronically Signed   By: Marnee Spring M.D.   On: 05/15/2020 05:45   DG Foot Complete Left  Result Date: 05/15/2020 CLINICAL DATA:  Pain.  Popping sensation in the heel EXAM: LEFT FOOT - COMPLETE 3+ VIEW COMPARISON:  None available FINDINGS: There is no evidence of fracture or dislocation. Small heel and talar neck spurs. No joint narrowing or spurring. IMPRESSION: No acute or erosive finding. Small heel spur. Electronically Signed   By: Marnee Spring M.D.   On: 05/15/2020 05:43    Procedures Procedures (including critical care time)  Medications Ordered in ED Medications  ibuprofen (ADVIL) tablet 800 mg (800 mg Oral Given 05/15/20 4193)    ED Course   I have reviewed the triage vital signs and the nursing notes.  Pertinent labs & imaging results that were available during my care of the patient were reviewed by me and considered in my medical decision making (see chart for details).    MDM Rules/Calculators/A&P                          Foot pain of feeling pop in the heel with running up some stairs.  Neurovascularly intact.  Achilles appears intact.  X-rays show small heel spur. No  fracture or dislocation. Suspect patient may have tear of plantar fascia. His Achilles tendon appears to be intact.  We will give NSAIDs, CAM Walker, crutches, orthopedic follow-up. Return precautions discussed. Final Clinical Impression(s) / ED Diagnoses Final diagnoses:  Pain of left heel    Rx / DC Orders ED Discharge Orders    None       Bette Brienza, Jeannett Senior, MD 05/15/20 [phone]

## 2020-05-15 NOTE — Discharge Instructions (Signed)
Take the anti-inflammatories as prescribed. He may put weight on the leg as tolerated. Follow-up with the bone doctor in the office. Return to the ED with worsening pain, weakness, numbness, tingling, or any other concerns.

## 2020-05-20 ENCOUNTER — Ambulatory Visit (INDEPENDENT_AMBULATORY_CARE_PROVIDER_SITE_OTHER): Payer: Managed Care, Other (non HMO) | Admitting: Family Medicine

## 2020-05-20 ENCOUNTER — Encounter: Payer: Self-pay | Admitting: Family Medicine

## 2020-05-20 ENCOUNTER — Ambulatory Visit: Payer: Self-pay

## 2020-05-20 ENCOUNTER — Other Ambulatory Visit: Payer: Self-pay

## 2020-05-20 VITALS — BP 116/70 | HR 64 | Ht 70.0 in | Wt 155.0 lb

## 2020-05-20 DIAGNOSIS — S93692A Other sprain of left foot, initial encounter: Secondary | ICD-10-CM | POA: Diagnosis not present

## 2020-05-20 DIAGNOSIS — M79672 Pain in left foot: Secondary | ICD-10-CM

## 2020-05-20 MED ORDER — IBUPROFEN 600 MG PO TABS: 600.0000 mg | ORAL_TABLET | Freq: Three times a day (TID) | ORAL | 1 refills | Status: AC | PRN

## 2020-05-20 NOTE — Patient Instructions (Signed)
Nice to meet you Please try ice  Please try compression  Please try crutches   Please send me a message in MyChart with any questions or updates.  Please see me back in 3 weeks.   --Dr. Jordan Likes

## 2020-05-20 NOTE — Progress Notes (Signed)
Christian Burch - 34 y.o. male MRN [id number]  Date of birth: 1986/07/17  SUBJECTIVE:  Including CC & ROS.  Chief Complaint  Patient presents with  . Foot Pain    left heel x 5 days    Christian Burch is a 34 y.o. male that is presenting with left heel pain.  He was running upstairs and felt a pop in the plantar aspect of his heel.  Since that time he has had pain with any weightbearing.  He had a similar instance about a year and a half ago.  He has been using a cam walker with limited improvement.  The pain is up to the heel and the longitudinal arch..  Independent review of the left ankle and foot x-ray from 7/28 shows no acute changes.   Review of Systems See HPI   HISTORY: Past Medical, Surgical, Social, and Family History Reviewed & Updated per EMR.   Pertinent Historical Findings include:  Past Medical History:  Diagnosis Date  . Seasonal allergies     No past surgical history on file.  Family History  Problem Relation Age of Onset  . Sudden death Neg Hx   . Hypertension Neg Hx   . Heart attack Neg Hx   . Hyperlipidemia Neg Hx   . Diabetes Neg Hx     Social History   Socioeconomic History  . Marital status: Single    Spouse name: Not on file  . Number of children: Not on file  . Years of education: Not on file  . Highest education level: Not on file  Occupational History  . Not on file  Tobacco Use  . Smoking status: Current Some Day Smoker    Types: Cigars  . Smokeless tobacco: Never Used  Vaping Use  . Vaping Use: Never used  Substance and Sexual Activity  . Alcohol use: No    Comment: social  . Drug use: No  . Sexual activity: Not on file  Other Topics Concern  . Not on file  Social History Narrative  . Not on file   Social Determinants of Health   Financial Resource Strain:   . Difficulty of Paying Living Expenses:   Food Insecurity:   . Worried About Programme researcher, broadcasting/film/video in the Last Year:   . Barista in the Last Year:     Transportation Needs:   . Freight forwarder (Medical):   Marland Kitchen Lack of Transportation (Non-Medical):   Physical Activity:   . Days of Exercise per Week:   . Minutes of Exercise per Session:   Stress:   . Feeling of Stress :   Social Connections:   . Frequency of Communication with Friends and Family:   . Frequency of Social Gatherings with Friends and Family:   . Attends Religious Services:   . Active Member of Clubs or Organizations:   . Attends Banker Meetings:   Marland Kitchen Marital Status:   Intimate Partner Violence:   . Fear of Current or Ex-Partner:   . Emotionally Abused:   Marland Kitchen Physically Abused:   . Sexually Abused:      PHYSICAL EXAM:  VS: BP 116/70   Pulse 64   Ht 5\' 10"  (1.778 m)   Wt 155 lb (70.3 kg)   BMI 22.24 kg/m  Physical Exam Gen: NAD, alert, cooperative with exam, well-appearing MSK:  Left foot: Abnormality appreciated of the longitudinal arch with swelling To palpation at the base of the heel into  the longitudinal arch. Unable to bear weight without significant pain. Normal ankle range of motion. Neurovascularly intact  Limited ultrasound: Left heel:  Origin of the plantar fascia is thickened and having hypoechoic changes.  There appears to be a disruption of the mid belly of the plantar fascia as it extends towards the toes.  Summary: Plantar fashion rupture.  Ultrasound and interpretation by Clare Gandy, MD    ASSESSMENT & PLAN:   Rupture of plantar fascia of left foot Findings on ultrasound and clinical exam would suggest a rupture of the plantar fascia. -Placed a scaphoid pad within the cam walker and counseled on using crutches. -Provided midfoot arch strap. -Counseled supportive care. -Follow-up in 3 weeks.  Could consider physical therapy or MRI at that time.

## 2020-05-20 NOTE — Assessment & Plan Note (Signed)
Findings on ultrasound and clinical exam would suggest a rupture of the plantar fascia. -Placed a scaphoid pad within the cam walker and counseled on using crutches. -Provided midfoot arch strap. -Counseled supportive care. -Follow-up in 3 weeks.  Could consider physical therapy or MRI at that time.

## 2020-06-10 ENCOUNTER — Ambulatory Visit (INDEPENDENT_AMBULATORY_CARE_PROVIDER_SITE_OTHER): Payer: Self-pay | Admitting: Family Medicine

## 2020-06-10 ENCOUNTER — Encounter: Payer: Self-pay | Admitting: Family Medicine

## 2020-06-10 DIAGNOSIS — S93692D Other sprain of left foot, subsequent encounter: Secondary | ICD-10-CM

## 2020-06-10 NOTE — Assessment & Plan Note (Signed)
Pain is improved and he is able to do most activities. -Counseled on home exercise therapy and supportive care. -Provided work note. -Follow-up as needed.

## 2020-06-10 NOTE — Patient Instructions (Signed)
Good to see you Please use ice as needed  Please do the stretches   Please send me a message in MyChart with any questions or updates.  Please see [place] back as needed.   --Dr. Jordan Likes

## 2020-06-10 NOTE — Progress Notes (Signed)
Christian Burch - 34 y.o. male MRN [id number]  Date of birth: June 18, 1986  SUBJECTIVE:  Including CC & ROS.  Chief Complaint  Patient presents with  . Follow-up    left foot    Christian Burch is a 34 y.o. male that is following up for his left plantar fascial rupture.  He feels little to no pain.  He is able to do most activities without pain.  He does notice pain with her she steps in the morning or getting up while seated.   Review of Systems See HPI   HISTORY: Past Medical, Surgical, Social, and Family History Reviewed & Updated per EMR.   Pertinent Historical Findings include:  Past Medical History:  Diagnosis Date  . Seasonal allergies     No past surgical history on file.  Family History  Problem Relation Age of Onset  . Sudden death Neg Hx   . Hypertension Neg Hx   . Heart attack Neg Hx   . Hyperlipidemia Neg Hx   . Diabetes Neg Hx     Social History   Socioeconomic History  . Marital status: Single    Spouse name: Not on file  . Number of children: Not on file  . Years of education: Not on file  . Highest education level: Not on file  Occupational History  . Not on file  Tobacco Use  . Smoking status: Current Some Day Smoker    Types: Cigars  . Smokeless tobacco: Never Used  Vaping Use  . Vaping Use: Never used  Substance and Sexual Activity  . Alcohol use: No    Comment: social  . Drug use: No  . Sexual activity: Not on file  Other Topics Concern  . Not on file  Social History Narrative  . Not on file   Social Determinants of Health   Financial Resource Strain:   . Difficulty of Paying Living Expenses: Not on file  Food Insecurity:   . Worried About Programme researcher, broadcasting/film/video in the Last Year: Not on file  . Ran Out of Food in the Last Year: Not on file  Transportation Needs:   . Lack of Transportation (Medical): Not on file  . Lack of Transportation (Non-Medical): Not on file  Physical Activity:   . Days of Exercise per Week: Not on file  .  Minutes of Exercise per Session: Not on file  Stress:   . Feeling of Stress : Not on file  Social Connections:   . Frequency of Communication with Friends and Family: Not on file  . Frequency of Social Gatherings with Friends and Family: Not on file  . Attends Religious Services: Not on file  . Active Member of Clubs or Organizations: Not on file  . Attends Banker Meetings: Not on file  . Marital Status: Not on file  Intimate Partner Violence:   . Fear of Current or Ex-Partner: Not on file  . Emotionally Abused: Not on file  . Physically Abused: Not on file  . Sexually Abused: Not on file     PHYSICAL EXAM:  VS: BP 111/67   Pulse 64   Ht 5\' 10"  (1.778 m)   Wt 155 lb (70.3 kg)   BMI 22.24 kg/m  Physical Exam Gen: NAD, alert, cooperative with exam, well-appearing   ASSESSMENT & PLAN:   Rupture of plantar fascia of left foot Pain is improved and he is able to do most activities. -Counseled on home exercise therapy and supportive  care. -Provided work note. -Follow-up as needed.

## 2021-09-17 ENCOUNTER — Emergency Department (HOSPITAL_BASED_OUTPATIENT_CLINIC_OR_DEPARTMENT_OTHER): Payer: 59

## 2021-09-17 ENCOUNTER — Encounter (HOSPITAL_BASED_OUTPATIENT_CLINIC_OR_DEPARTMENT_OTHER): Payer: Self-pay

## 2021-09-17 ENCOUNTER — Emergency Department (HOSPITAL_BASED_OUTPATIENT_CLINIC_OR_DEPARTMENT_OTHER)
Admission: EM | Admit: 2021-09-17 | Discharge: 2021-09-17 | Disposition: A | Discharge status: Home / Self Care | Payer: 59 | Attending: Emergency Medicine | Admitting: Emergency Medicine

## 2021-09-17 ENCOUNTER — Other Ambulatory Visit: Payer: Self-pay

## 2021-09-17 DIAGNOSIS — Z20822 Contact with and (suspected) exposure to covid-19: Secondary | ICD-10-CM | POA: Insufficient documentation

## 2021-09-17 DIAGNOSIS — J101 Influenza due to other identified influenza virus with other respiratory manifestations: Secondary | ICD-10-CM | POA: Diagnosis not present

## 2021-09-17 DIAGNOSIS — R0602 Shortness of breath: Secondary | ICD-10-CM | POA: Diagnosis present

## 2021-09-17 DIAGNOSIS — F17290 Nicotine dependence, other tobacco product, uncomplicated: Secondary | ICD-10-CM | POA: Insufficient documentation

## 2021-09-17 LAB — RESP PANEL BY RT-PCR (FLU A&B, COVID) ARPGX2
Influenza A by PCR: POSITIVE — AB
Influenza B by PCR: NEGATIVE
SARS Coronavirus 2 by RT PCR: NEGATIVE

## 2021-09-17 MED ORDER — DEXAMETHASONE SODIUM PHOSPHATE 10 MG/ML IJ SOLN
10.0000 mg | Freq: Once | INTRAMUSCULAR | Status: AC
  Administered 2021-09-17: 10 mg via INTRAMUSCULAR
  Filled 2021-09-17: qty 1

## 2021-09-17 NOTE — ED Provider Notes (Signed)
[facility] EMERGENCY DEPARTMENT Provider Note   CSN: [id number] Arrival date & time: 09/17/21  1039     History Chief Complaint  Patient presents with   Shortness of Breath    Christian Burch is a 35 y.o. male.  Patient presents to ER chief complaint of cough and shortness of breath.  Symptoms started yesterday persisted all today.  His family member brought him to the ER for evaluation.  States that his chest hurts when he coughs.  No reports of vomiting or diarrhea.  Complaining of generalized body aches as well.      Past Medical History:  Diagnosis Date   Seasonal allergies     Patient Active Problem List   Diagnosis Date Noted   Rupture of plantar fascia of left foot 05/20/2020   Left knee injury 02/02/2012    History reviewed. No pertinent surgical history.     Family History  Problem Relation Age of Onset   Sudden death Neg Hx    Hypertension Neg Hx    Heart attack Neg Hx    Hyperlipidemia Neg Hx    Diabetes Neg Hx     Social History   Tobacco Use   Smoking status: Some Days    Types: Cigars   Smokeless tobacco: Never  Vaping Use   Vaping Use: Never used  Substance Use Topics   Alcohol use: No    Comment: social   Drug use: No    Home Medications Prior to Admission medications   Medication Sig Start Date End Date Taking? Authorizing Provider  Cetirizine HCl (ZYRTEC ALLERGY PO) Take by mouth.    [provider]  ibuprofen (ADVIL) 600 MG tablet Take 1 tablet (600 mg total) by mouth every 8 (eight) hours as needed. 05/20/20   Myra Rude, MD  naproxen (NAPROSYN) 500 MG tablet Take one tablet twice daily as needed for pain. 03/16/17   Molpus, Jonny Ruiz, MD    Allergies    Patient has no known allergies.  Review of Systems   Review of Systems  Constitutional:  Negative for fever.  HENT:  Negative for ear pain and sore throat.   Eyes:  Negative for pain.  Respiratory:  Positive for cough and shortness of breath.    Cardiovascular:  Negative for chest pain.  Gastrointestinal:  Negative for abdominal pain.  Genitourinary:  Negative for flank pain.  Musculoskeletal:  Negative for back pain.  Skin:  Negative for color change and rash.  Neurological:  Negative for syncope.  All other systems reviewed and are negative.  Physical Exam Updated Vital Signs BP 124/81 (BP Location: Left Arm)   Pulse 100   Temp 98.8 F (37.1 C) (Oral)   Resp (!) 30   Ht 5\' 10"  (1.778 m)   Wt 68 kg   SpO2 100%   BMI 21.52 kg/m   Physical Exam Constitutional:      Appearance: He is well-developed.  HENT:     Head: Normocephalic.     Nose: Nose normal.  Eyes:     Extraocular Movements: Extraocular movements intact.  Cardiovascular:     Rate and Rhythm: Normal rate.  Pulmonary:     Effort: Tachypnea present.     Breath sounds: No decreased breath sounds.  Skin:    Coloration: Skin is not jaundiced.  Neurological:     Mental Status: He is alert. Mental status is at baseline.    ED Results / Procedures / Treatments   Labs (all labs  ordered are listed, but only abnormal results are displayed) Labs Reviewed  RESP PANEL BY RT-PCR (FLU A&B, COVID) ARPGX2 - Abnormal; Notable for the following components:      Result Value   Influenza A by PCR POSITIVE (*)    All other components within normal limits    EKG None  Radiology DG Chest Port 1 View  Result Date: 09/17/2021 CLINICAL DATA:  Cough and shortness of breath over the last 2 days. EXAM: PORTABLE CHEST 1 VIEW COMPARISON:  04/22/2016 FINDINGS: The heart size and mediastinal contours are within normal limits. Both lungs are clear. The visualized skeletal structures are unremarkable. IMPRESSION: No active disease. Electronically Signed   By: Paulina Fusi M.D.   On: 09/17/2021 11:20    Procedures Procedures   Medications Ordered in ED Medications  dexamethasone (DECADRON) injection 10 mg (10 mg Intramuscular Given 09/17/21 1140)    ED Course  I have  reviewed the triage vital signs and the nursing notes.  Pertinent labs & imaging results that were available during my care of the patient were reviewed by me and considered in my medical decision making (see chart for details).    MDM Rules/Calculators/A&P                           Vital signs show some moderate tachypnea but otherwise within normal limits.  Chest x-ray is unremarkable per radiology, viral panel positive for influenza.  Patient given Decadron improvement of symptoms feeling much better at this time.  Recommending outpatient follow-up with his doctor within 3 to 4 days, advised return if he has difficulty breathing worsening symptoms or any additional concerns.  Final Clinical Impression(s) / ED Diagnoses Final diagnoses:  Influenza A    Rx / DC Orders ED Discharge Orders     None        Cheryll Cockayne, MD 09/17/21 1237

## 2021-09-17 NOTE — Discharge Instructions (Addendum)
Call your primary care doctor or specialist as discussed in the next 2-3 days.   Return immediately back to the ER if:  Your symptoms worsen within the next 12-24 hours. You develop new symptoms such as new fevers, persistent vomiting, new pain, shortness of breath, or new weakness or numbness, or if you have any other concerns.  

## 2021-09-17 NOTE — ED Triage Notes (Signed)
Pt started coughing yesterday productive yellow sputum.  Pt states he is sob, hyperventilating in triage.  Pox 100%.  Denies fever or nasal congestion.  No history of asthma.

## 2023-02-02 ENCOUNTER — Encounter: Payer: Self-pay | Admitting: *Deleted

## 2023-02-17 ENCOUNTER — Encounter (HOSPITAL_BASED_OUTPATIENT_CLINIC_OR_DEPARTMENT_OTHER): Payer: Self-pay

## 2023-02-17 ENCOUNTER — Emergency Department (HOSPITAL_BASED_OUTPATIENT_CLINIC_OR_DEPARTMENT_OTHER)
Admission: EM | Admit: 2023-02-17 | Discharge: 2023-02-17 | Disposition: A | Discharge status: Home / Self Care | Payer: 59 | Attending: Emergency Medicine | Admitting: Emergency Medicine

## 2023-02-17 ENCOUNTER — Other Ambulatory Visit: Payer: Self-pay

## 2023-02-17 DIAGNOSIS — R42 Dizziness and giddiness: Secondary | ICD-10-CM | POA: Insufficient documentation

## 2023-02-17 DIAGNOSIS — R110 Nausea: Secondary | ICD-10-CM | POA: Diagnosis not present

## 2023-02-17 DIAGNOSIS — R519 Headache, unspecified: Secondary | ICD-10-CM | POA: Diagnosis not present

## 2023-02-17 LAB — CBC
HCT: 46.7 % (ref 39.0–52.0)
Hemoglobin: 15.5 g/dL (ref 13.0–17.0)
MCH: 31.3 pg (ref 26.0–34.0)
MCHC: 33.2 g/dL (ref 30.0–36.0)
MCV: 94.3 fL (ref 80.0–100.0)
Platelets: 234 10*3/uL (ref 150–400)
RBC: 4.95 MIL/uL (ref 4.22–5.81)
RDW: 13.4 % (ref 11.5–15.5)
WBC: 6 10*3/uL (ref 4.0–10.5)
nRBC: 0 % (ref 0.0–0.2)

## 2023-02-17 LAB — BASIC METABOLIC PANEL
Anion gap: 13 (ref 5–15)
BUN: 12 mg/dL (ref 6–20)
CO2: 29 mmol/L (ref 22–32)
Calcium: 9.9 mg/dL (ref 8.9–10.3)
Chloride: 98 mmol/L (ref 98–111)
Creatinine, Ser: 0.95 mg/dL (ref 0.61–1.24)
GFR, Estimated: >60 mL/min (ref >60)
Glucose, Bld: 91 mg/dL (ref 70–99)
Potassium: 3.7 mmol/L (ref 3.5–5.1)
Sodium: 140 mmol/L (ref 135–145)

## 2023-02-17 LAB — URINALYSIS, ROUTINE W REFLEX MICROSCOPIC
Bilirubin Urine: NEGATIVE
Glucose, UA: NEGATIVE mg/dL
Hgb urine dipstick: NEGATIVE
Ketones, ur: NEGATIVE mg/dL
Leukocytes,Ua: NEGATIVE
Nitrite: NEGATIVE
Protein, ur: NEGATIVE mg/dL
Specific Gravity, Urine: 1.015 (ref 1.005–1.030)
pH: 7 (ref 5.0–8.0)

## 2023-02-17 MED ORDER — ACETAMINOPHEN 325 MG PO TABS: 650.0000 mg | ORAL_TABLET | Freq: Four times a day (QID) | ORAL | 0 refills | Status: AC | PRN

## 2023-02-17 MED ORDER — ONDANSETRON 4 MG PO TBDP
4.0000 mg | ORAL_TABLET | Freq: Once | ORAL | Status: AC
  Administered 2023-02-17: 4 mg via ORAL
  Filled 2023-02-17: qty 1

## 2023-02-17 MED ORDER — ACETAMINOPHEN 325 MG PO TABS
650.0000 mg | ORAL_TABLET | Freq: Once | ORAL | Status: AC
  Administered 2023-02-17: 650 mg via ORAL
  Filled 2023-02-17: qty 2

## 2023-02-17 MED ORDER — MECLIZINE HCL 25 MG PO TABS
25.0000 mg | ORAL_TABLET | Freq: Once | ORAL | Status: AC
  Administered 2023-02-17: 25 mg via ORAL
  Filled 2023-02-17: qty 1

## 2023-02-17 MED ORDER — ONDANSETRON 4 MG PO TBDP: 4.0000 mg | ORAL_TABLET | Freq: Three times a day (TID) | ORAL | 0 refills | Status: DC | PRN

## 2023-02-17 MED ORDER — MECLIZINE HCL 25 MG PO TABS: 25.0000 mg | ORAL_TABLET | Freq: Three times a day (TID) | ORAL | 0 refills | Status: AC | PRN

## 2023-02-17 NOTE — Discharge Instructions (Addendum)
Please take Antivert for dizziness. Take tylenol/ibuprofen for pain. I recommend close follow-up with PCP for reevaluation.  Please do not hesitate to return to emergency department if worrisome signs symptoms we discussed become apparent.

## 2023-02-17 NOTE — ED Triage Notes (Signed)
Pt c/o dizziness when changing positions since Friday. States feels like the room is spinning intermittently. Denies other symptoms.

## 2023-02-17 NOTE — ED Provider Notes (Signed)
[place] [facility] AT [facility] Provider Note   CSN: [id number] Arrival date & time: 02/17/23  1637     History  Chief Complaint  Patient presents with   Dizziness    Christian Burch is a 37 y.o. male otherwise healthy presents today for evaluation of dizziness.  Patient states he has had dizziness since Friday.  States the symptoms have progressively gotten worse with more episodes of dizziness today.  Endorses nausea without vomiting.  States he felt the room was spinning intermittently.  States he also had headache but denies any recent fall, head injury or the symptoms started.  No history of vertigo.  Denies fever, cough, runny nose, chest pain, shortness of breath, bowel change, urinary symptoms.   Dizziness     Past Medical History:  Diagnosis Date   Seasonal allergies    History reviewed. No pertinent surgical history.   Home Medications Prior to Admission medications   Medication Sig Start Date End Date Taking? Authorizing Provider  Cetirizine HCl (ZYRTEC ALLERGY PO) Take by mouth.    [provider]  ibuprofen (ADVIL) 600 MG tablet Take 1 tablet (600 mg total) by mouth every 8 (eight) hours as needed. 05/20/20   Myra Rude, MD  naproxen (NAPROSYN) 500 MG tablet Take one tablet twice daily as needed for pain. 03/16/17   Molpus, Jonny Ruiz, MD      Allergies    Patient has no known allergies.    Review of Systems   Review of Systems  Neurological:  Positive for dizziness.    Physical Exam Updated Vital Signs BP 116/83   Pulse (!) 56   Temp 98.5 F (36.9 C)   Resp 16   Ht 5\' 10"  (1.778 m)   Wt 72.6 kg   SpO2 100%   BMI 22.96 kg/m  Physical Exam Vitals and nursing note reviewed.  Constitutional:      Appearance: Normal appearance.  HENT:     Head: Normocephalic and atraumatic.     Mouth/Throat:     Mouth: Mucous membranes are moist.  Eyes:     General: No scleral icterus. Cardiovascular:     Rate and Rhythm:  Normal rate and regular rhythm.     Pulses: Normal pulses.     Heart sounds: Normal heart sounds.  Pulmonary:     Effort: Pulmonary effort is normal.     Breath sounds: Normal breath sounds.  Abdominal:     General: Abdomen is flat.     Palpations: Abdomen is soft.     Tenderness: There is no abdominal tenderness.  Musculoskeletal:        General: No deformity.  Skin:    General: Skin is warm.     Findings: No rash.  Neurological:     General: No focal deficit present.     Mental Status: He is alert.     Comments: Cranial nerves II through XII intact. Intact sensation to light touch in all 4 extremities. 5/5 strength in all 4 extremities. Intact finger-to-nose and heel-to-shin of all 4 extremities. No visual field cuts. No neglect noted. No aphasia noted.    Psychiatric:        Mood and Affect: Mood normal.    ED Results / Procedures / Treatments   Labs (all labs ordered are listed, but only abnormal results are displayed) Labs Reviewed  CBC  BASIC METABOLIC PANEL  URINALYSIS, ROUTINE W REFLEX MICROSCOPIC    EKG EKG Interpretation  Date/Time:  Wednesday  Feb 17 2023 16:46:55 EDT Ventricular Rate:  60 PR Interval:  120 QRS Duration: 89 QT Interval:  379 QTC Calculation: 379 R Axis:   68 Text Interpretation: Sinus rhythm ST elevations appear like early repolarization. No prior ECG for comparison. No STEMI Confirmed by Theda Belfast ([id number]) on 02/17/2023 4:56:21 PM  Radiology No results found.  Procedures Procedures    Medications Ordered in ED Medications  ondansetron (ZOFRAN-ODT) disintegrating tablet 4 mg (has no administration in time range)  meclizine (ANTIVERT) tablet 25 mg (has no administration in time range)  acetaminophen (TYLENOL) tablet 650 mg (has no administration in time range)    ED Course/ Medical Decision Making/ A&P                             Medical Decision Making Amount and/or Complexity of Data Reviewed Labs: ordered.  Risk OTC  drugs. Prescription drug management.   This patient presents to the ED for dizziness, this involves an extensive number of treatment options, and is a complaint that carries with a high risk of complications and morbidity.  The differential diagnosis includes CVA, ICH, multiple sclerosis, acoustic neuroma, carotid artery dissection, BPPV, labyrinthitis, Mnire's.  This is not an exhaustive list.  Lab tests: I ordered and personally interpreted labs.  The pertinent results include: WBC unremarkable. Hbg unremarkable. Platelets unremarkable. Electrolytes unremarkable. BUN, creatinine unremarkable.  Urinalysis unremarkable.  Problem list/ ED course/ Critical interventions/ Medical management: HPI: See above Vital signs within normal range and stable throughout visit. Laboratory/imaging studies significant for: See above. On physical examination, patient is afebrile and appears in no acute distress. This patient presents with dizziness, most consistent with a peripheral cause, likely BPPV. No history of recent infection so doubt vestibular neuritis. History not consistent with meniere's disease. No history of trauma. No red flag features for central vertigo to include gradual onset, vertical/bidirectional or non-fatigable nystagmus, focal neurologic findings on exam (including inability to ambulate, ataxia, dysmetria). Presentation not consistent with an acute CNS infection, vertebral basilar artery insufficiency, cerebellar hemorrhage or infarction, intracranial mass or bleed.  Given Tylenol, Antivert, Zofran here.  Reevaluation of patient after the medication showed that the patient improved. Advised patient to take Antivert for dizziness, Tylenol/ibuprofen/naproxen for pain, follow-up with primary care physician for further evaluation and management, return to the ER if new or worsening symptoms.   I have reviewed the patient home medicines and have made adjustments as needed.  Cardiac  monitoring/EKG: The patient was maintained on a cardiac monitor.  I personally reviewed and interpreted the cardiac monitor which showed an underlying rhythm of: sinus rhythm.  Additional history obtained: External records from outside source obtained and reviewed including: Chart review including previous notes, labs, imaging.  Consultations obtained:  Disposition Continued outpatient therapy. Follow-up with PCP recommended for reevaluation of symptoms. Treatment plan discussed with patient.  Pt acknowledged understanding was agreeable to the plan. Worrisome signs and symptoms were discussed with patient, and patient acknowledged understanding to return to the ED if they noticed these signs and symptoms. Patient was stable upon discharge.   This chart was dictated using voice recognition software.  Despite best efforts to proofread,  errors can occur which can change the documentation meaning.          Final Clinical Impression(s) / ED Diagnoses Final diagnoses:  Dizziness  Vertigo    Rx / DC Orders ED Discharge Orders  Ordered    meclizine (ANTIVERT) 25 MG tablet  3 times daily PRN        02/17/23 2028    ondansetron (ZOFRAN-ODT) 4 MG disintegrating tablet  Every 8 hours PRN        02/17/23 2028              Jeanelle Malling, [place] 02/17/23 2032    Tegeler, Canary Brim, MD 02/17/23 [phone]

## 2023-04-27 IMAGING — DX DG CHEST 1V PORT
1 series · 1 of 1 positions shown · non-contrast
Comparison: 04/22/2016

CLINICAL DATA: Cough and shortness of breath over the last 2 days.

EXAM:
PORTABLE CHEST 1 VIEW

[chest ap]
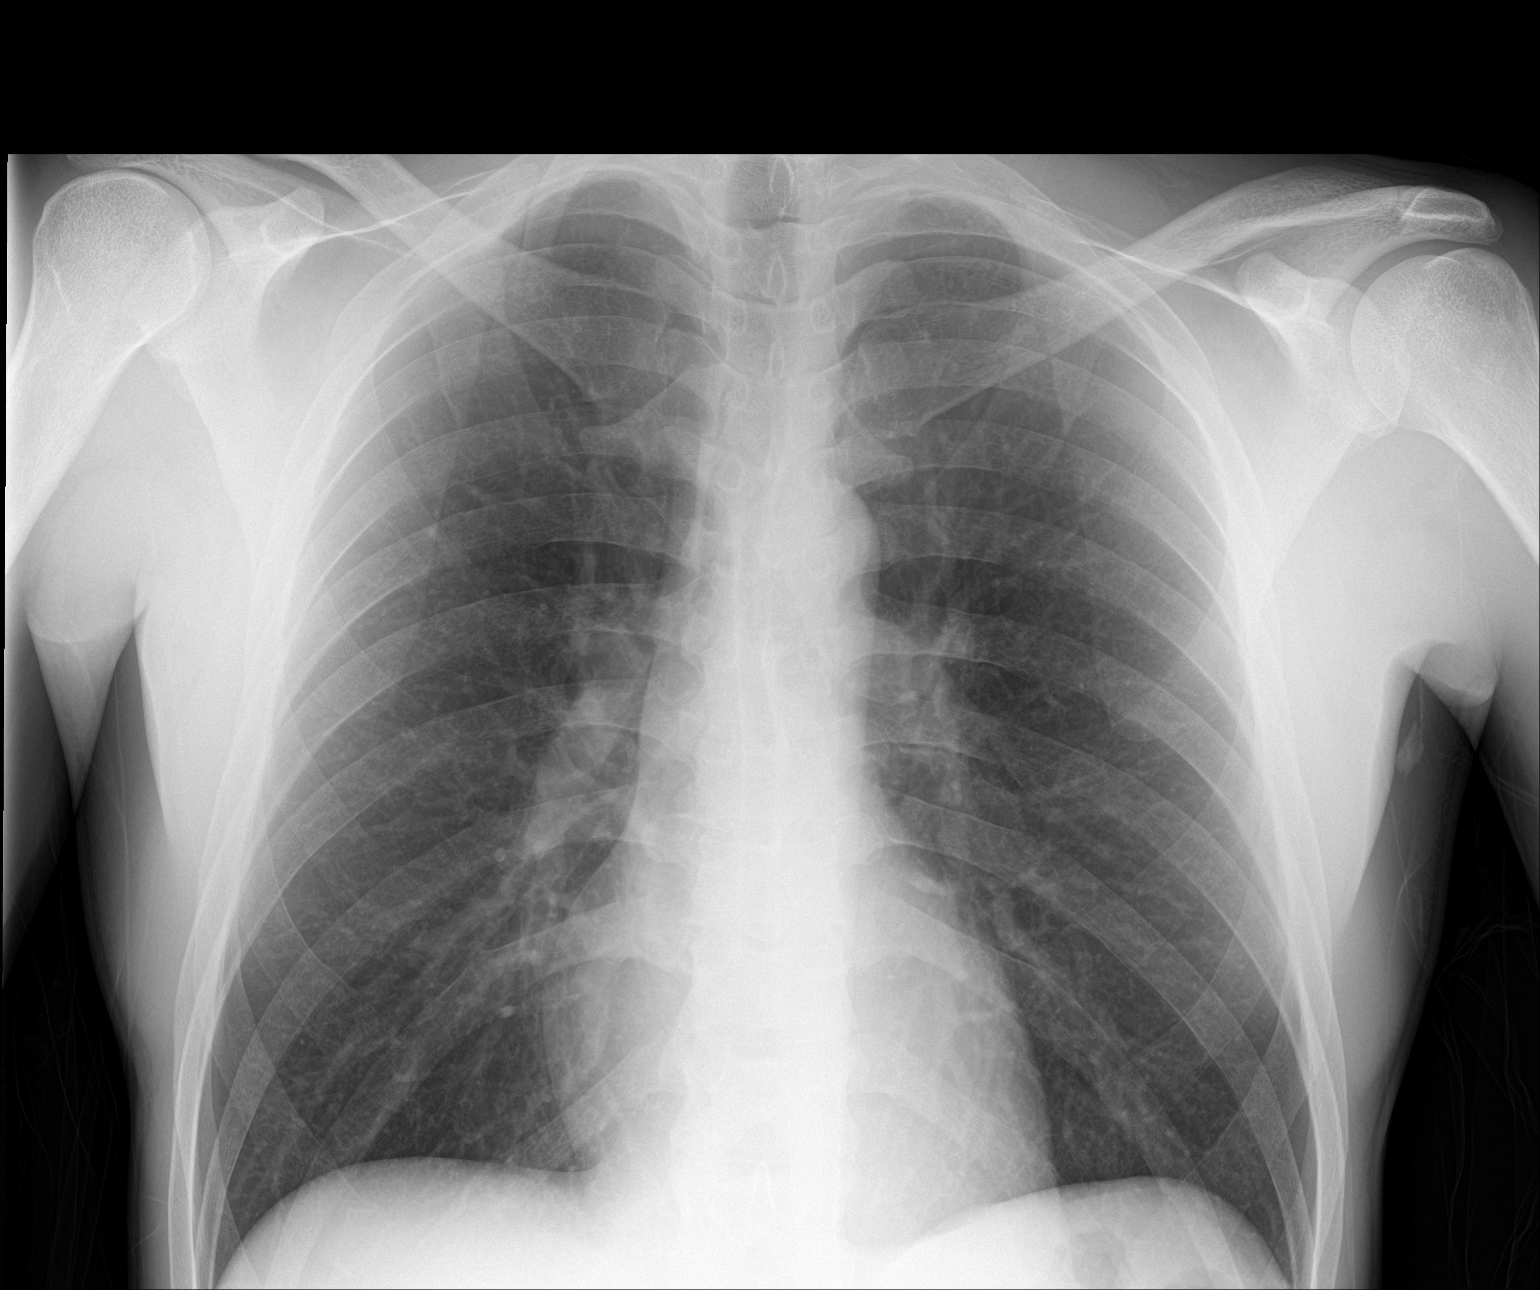

[1 of 1 positions shown; findings below may reference images not displayed]

FINDINGS: The heart size and mediastinal contours are within normal limits.
Both lungs are clear. The visualized skeletal structures are
unremarkable.
IMPRESSION: No active disease.

## 2023-11-30 ENCOUNTER — Other Ambulatory Visit: Payer: Self-pay

## 2023-11-30 ENCOUNTER — Encounter (HOSPITAL_BASED_OUTPATIENT_CLINIC_OR_DEPARTMENT_OTHER): Payer: Self-pay

## 2023-11-30 ENCOUNTER — Emergency Department (HOSPITAL_BASED_OUTPATIENT_CLINIC_OR_DEPARTMENT_OTHER)
Admission: EM | Admit: 2023-11-30 | Discharge: 2023-11-30 | Disposition: A | Discharge status: Home / Self Care | Payer: 59 | Attending: Emergency Medicine | Admitting: Emergency Medicine

## 2023-11-30 DIAGNOSIS — J029 Acute pharyngitis, unspecified: Secondary | ICD-10-CM | POA: Diagnosis present

## 2023-11-30 DIAGNOSIS — J101 Influenza due to other identified influenza virus with other respiratory manifestations: Secondary | ICD-10-CM | POA: Insufficient documentation

## 2023-11-30 LAB — RESP PANEL BY RT-PCR (RSV, FLU A&B, COVID)  RVPGX2
Influenza A by PCR: POSITIVE — AB
Influenza B by PCR: NEGATIVE
Resp Syncytial Virus by PCR: NEGATIVE
SARS Coronavirus 2 by RT PCR: NEGATIVE

## 2023-11-30 LAB — GROUP A STREP BY PCR: Group A Strep by PCR: NOT DETECTED

## 2023-11-30 MED ORDER — ONDANSETRON HCL 4 MG PO TABS: 4.0000 mg | ORAL_TABLET | Freq: Three times a day (TID) | ORAL | 0 refills | Status: AC | PRN

## 2023-11-30 NOTE — ED Triage Notes (Signed)
Pt arrives with c/o sore throat, headache, nausea, and fatigue that started today. Pt denies fevers.

## 2023-11-30 NOTE — ED Provider Notes (Signed)
[place] [facility] AT [facility] Provider Note   CSN: [id number] Arrival date & time: 11/30/23  1919     History  Chief Complaint  Patient presents with   URI    Christian Burch is a 38 y.o. male presents today for sore throat, headache, gesturing, nausea, and fatigue that began today.  Patient denies fever, shortness of breath, cough, chest pain, abdominal pain, vomiting, or diarrhea.   URI Presenting symptoms: sore throat   Associated symptoms: headaches        Home Medications Prior to Admission medications   Medication Sig Start Date End Date Taking? Authorizing Provider  ondansetron (ZOFRAN) 4 MG tablet Take 1 tablet (4 mg total) by mouth every 8 (eight) hours as needed for nausea or vomiting. 11/30/23  Yes Dolphus Jenny, PA-C  acetaminophen (TYLENOL) 325 MG tablet Take 2 tablets (650 mg total) by mouth every 6 (six) hours as needed. 02/17/23   Jeanelle Malling, PA  Cetirizine HCl (ZYRTEC ALLERGY PO) Take by mouth.    [provider]  ibuprofen (ADVIL) 600 MG tablet Take 1 tablet (600 mg total) by mouth every 8 (eight) hours as needed. 05/20/20   Myra Rude, MD  meclizine (ANTIVERT) 25 MG tablet Take 1 tablet (25 mg total) by mouth 3 (three) times daily as needed for dizziness. 02/17/23   Jeanelle Malling, PA  naproxen (NAPROSYN) 500 MG tablet Take one tablet twice daily as needed for pain. 03/16/17   Molpus, Jonny Ruiz, MD      Allergies    Patient has no known allergies.    Review of Systems   Review of Systems  HENT:  Positive for sore throat.   Gastrointestinal:  Positive for nausea.  Neurological:  Positive for headaches.    Physical Exam Updated Vital Signs BP 113/80 (BP Location: Left Arm)   Pulse 92   Temp 98.9 F (37.2 C) (Oral)   Resp 15   Ht 5\' 10"  (1.778 m)   Wt 72.6 kg   SpO2 98%   BMI 22.96 kg/m  Physical Exam Vitals and nursing note reviewed.  Constitutional:      General: He is not in acute distress.    Appearance: Normal  appearance. He is well-developed. He is not toxic-appearing or diaphoretic.  HENT:     Head: Normocephalic and atraumatic.     Right Ear: External ear normal.     Left Ear: External ear normal.     Nose: Congestion present. No rhinorrhea.     Mouth/Throat:     Mouth: Mucous membranes are moist.     Pharynx: Oropharynx is clear.  Eyes:     Extraocular Movements: Extraocular movements intact.     Conjunctiva/sclera: Conjunctivae normal.  Cardiovascular:     Rate and Rhythm: Normal rate and regular rhythm.     Pulses: Normal pulses.     Heart sounds: Normal heart sounds. No murmur heard. Pulmonary:     Effort: Pulmonary effort is normal. No respiratory distress.     Breath sounds: Normal breath sounds.  Abdominal:     Palpations: Abdomen is soft.  Musculoskeletal:        General: No swelling. Normal range of motion.     Cervical back: Normal range of motion and neck supple.  Skin:    General: Skin is warm and dry.     Capillary Refill: Capillary refill takes less than 2 seconds.  Neurological:     General: No focal deficit present.  Mental Status: He is alert.     Motor: No weakness.  Psychiatric:        Mood and Affect: Mood normal.     ED Results / Procedures / Treatments   Labs (all labs ordered are listed, but only abnormal results are displayed) Labs Reviewed  RESP PANEL BY RT-PCR (RSV, FLU A&B, COVID)  RVPGX2 - Abnormal; Notable for the following components:      Result Value   Influenza A by PCR POSITIVE (*)    All other components within normal limits  GROUP A STREP BY PCR    EKG None  Radiology No results found.  Procedures Procedures    Medications Ordered in ED Medications - No data to display  ED Course/ Medical Decision Making/ A&P                                 Medical Decision Making  This patient presents to the ED with chief complaint(s) of URI symptoms with pertinent past medical history of none which further complicates the  presenting complaint. The complaint involves an extensive differential diagnosis and also carries with it a high risk of complications and morbidity.    The differential diagnosis includes COVID, flu, RSV, strep pharyngitis, URI  Additional history obtained: Records reviewed Care Everywhere/External Records  ED Course and Reassessment:   Independent labs interpretation:  The following labs were independently interpreted:  Respiratory panel: Influenza A+ Strep PCR: Negative  Consultation: - Consulted or discussed management/test interpretation w/ external professional: None  Consideration for admission or further workup: Considered for mission further compartment patient vital signs, physical exam, and labs are reassuring. Patient's symptoms likely due to influenza A infection.  Patient prescribed short course of Zofran as needed for nausea/vomiting.  Patient advised to take Tylenol/Motrin as needed for fever, Flonase as needed for nasal congestion, and plain Mucinex as needed for chest congestion. Patient should follow-up with their primary care in the upcoming week if their symptoms persist for further evaluation and workup.         Final Clinical Impression(s) / ED Diagnoses Final diagnoses:  Influenza A    Rx / DC Orders ED Discharge Orders          Ordered    ondansetron (ZOFRAN) 4 MG tablet  Every 8 hours PRN        11/30/23 2055              Dolphus Jenny, PA-C 11/30/23 2059    Terald Sleeper, MD 12/01/23 1354

## 2023-11-30 NOTE — Discharge Instructions (Addendum)
Today you were seen for an influenza A infection.  Please pick up your Zofran and take as needed for nausea.  You may alternate taking Tylenol and Motrin as needed for fever and pain, Flonase as needed for nasal congestion, and plain Mucinex as needed for chest congestion.  You may return to work/school prior to the date listed on your excuse if you are fever free without Tylenol or Motrin for 24 hours and your symptoms are improving.  Thank you for letting us treat you today. After reviewing your labs and imaging, I feel you are safe to go home. Please follow up with your PCP in the next several days and provide them with your records from this visit. Return to the Emergency Room if pain becomes severe or symptoms worsen.
